# Patient Record
Sex: Female | Born: 1990 | Hispanic: Yes | Smoking: Never smoker
Health system: Southern US, Community
[De-identification: ages and names within clinical notes are randomized; demographics above are authoritative.]

---
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
## 2008-01-21 IMAGING — US US PELV - US TRANSVAGINAL
1 series · 17 of 25 positions shown · non-contrast
Comparison: none

REASON FOR EXAM: Severe sharp LLQ abdominal pain
COMMENTS:   LMP: Three weeks ago

PROCEDURE:     US  - US PELVIS MASS EXAM  - [DATE] [DATE] [DATE]  [DATE]
RESULT:     Comparison: No available comparison exam.

[Series 1: us pelv - us transvaginal · 17 of 30 slices shown]
[im 1/30]
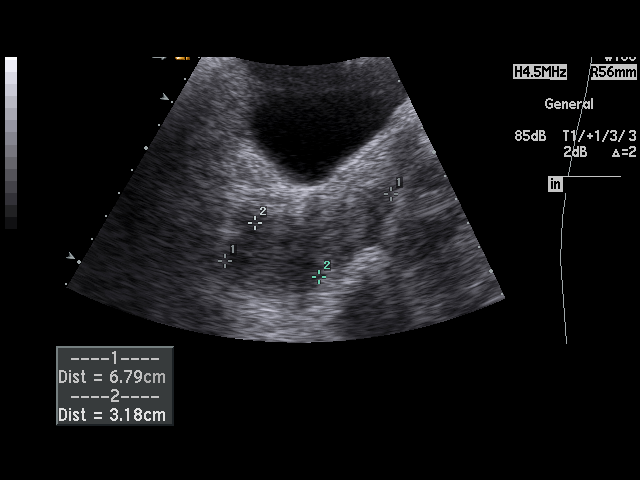
[im 3/30]
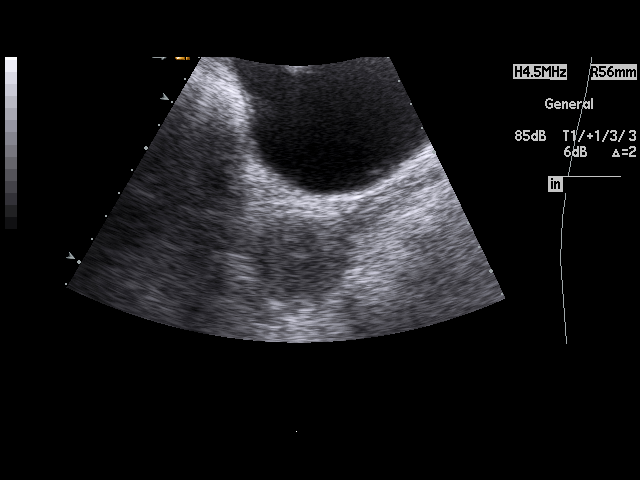
[im 4/30]
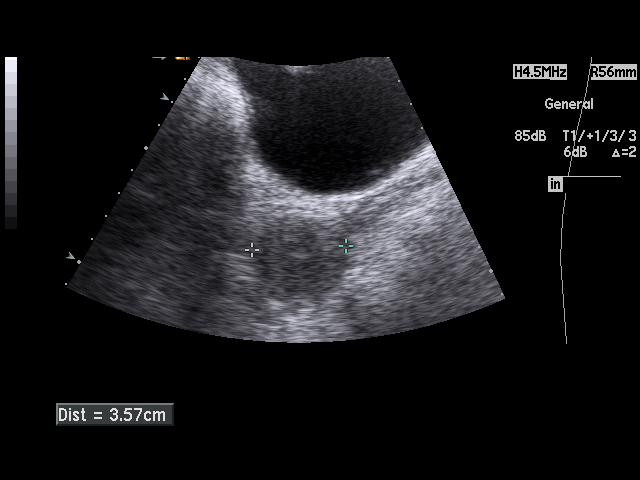
[im 7/30]
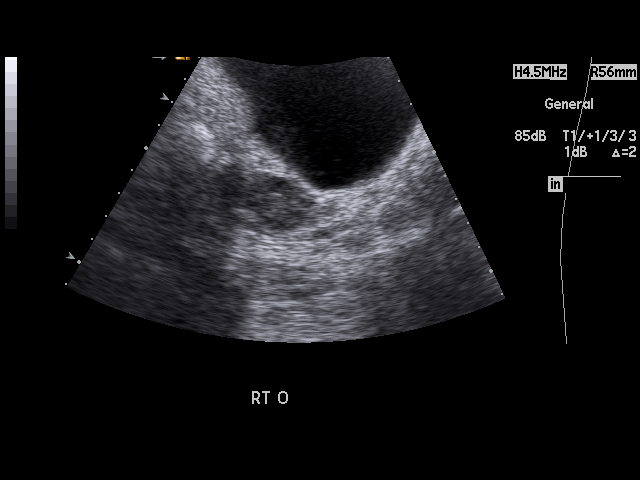
[im 8/30]
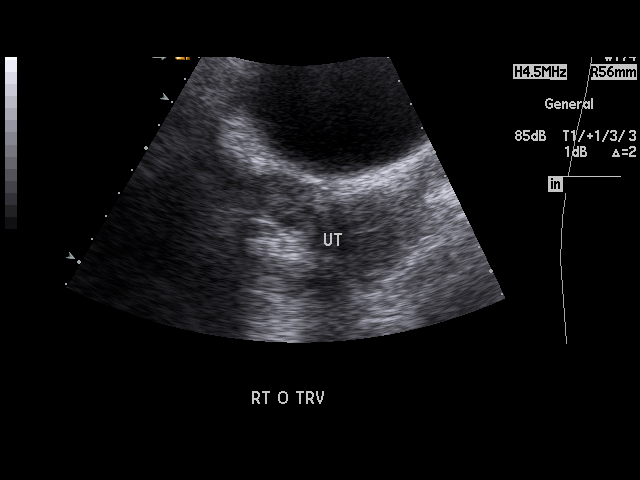
[im 10/30]
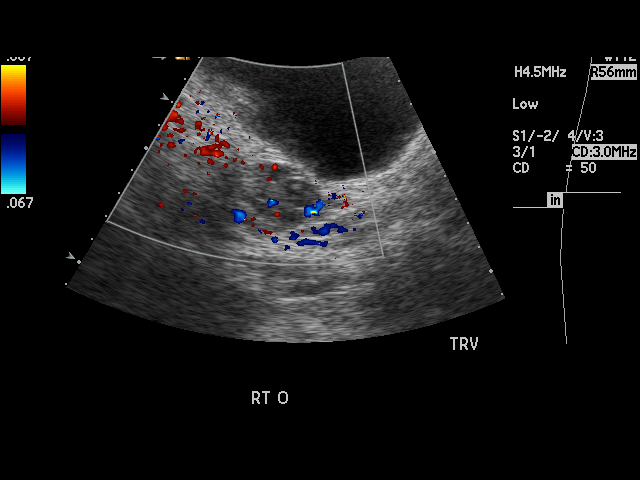
[im 11/30]
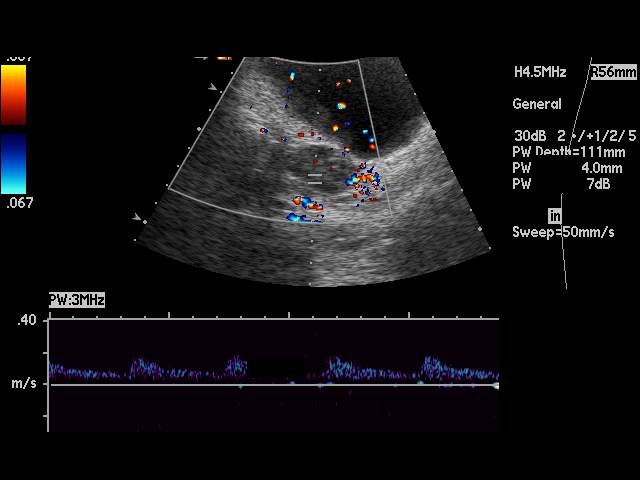
[im 14/30]
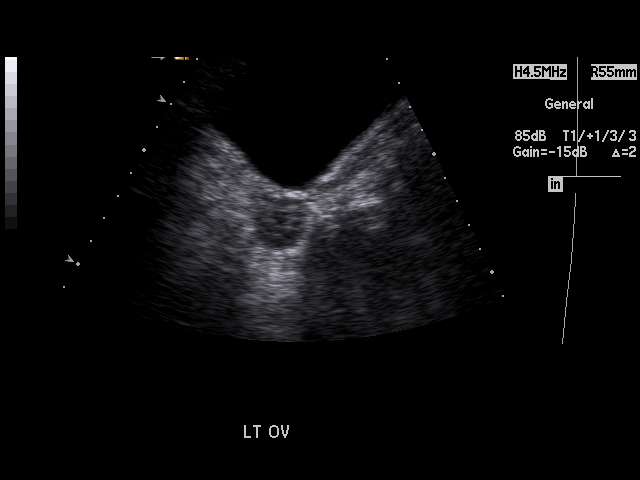
[im 15/30]
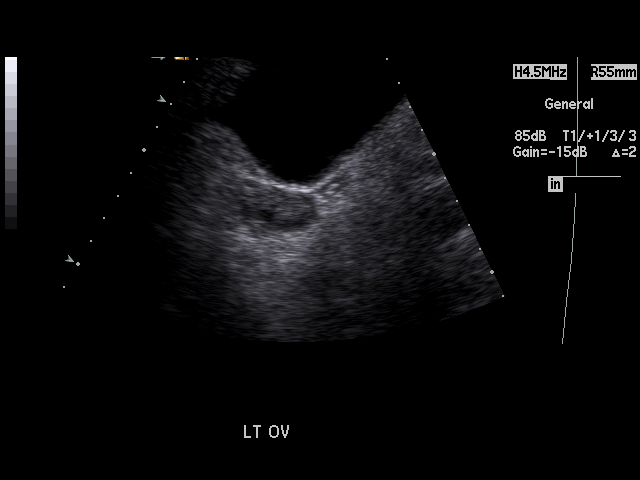
[im 16/30]
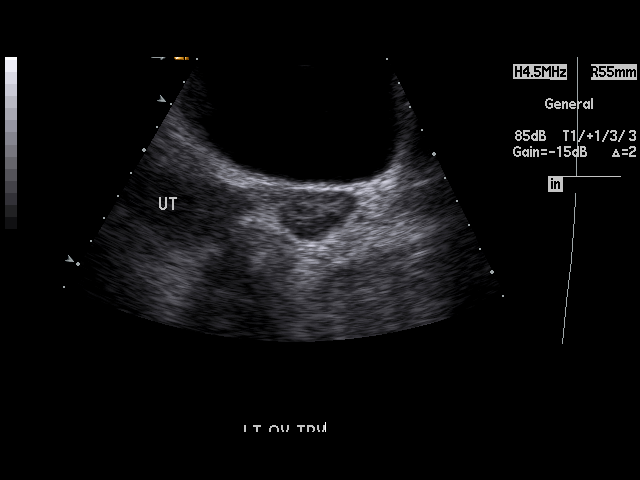
[im 19/30]
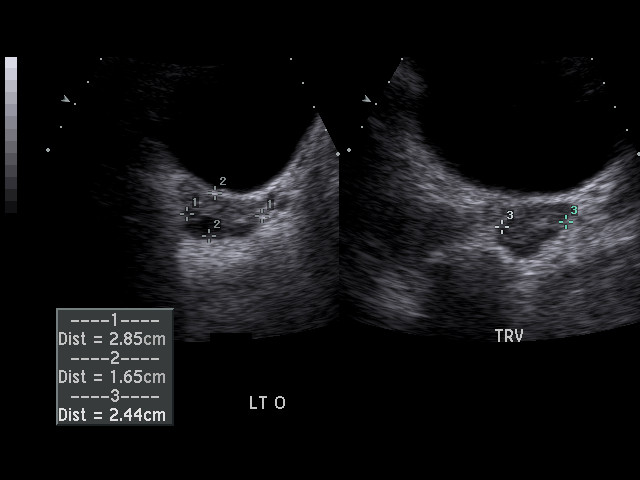
[im 20/30]
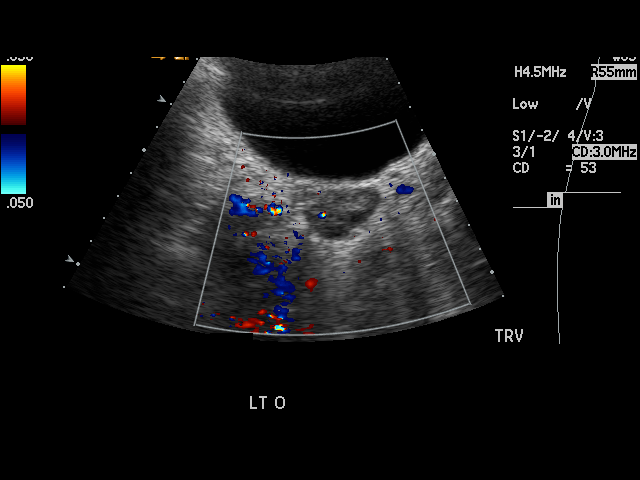
[im 22/30]
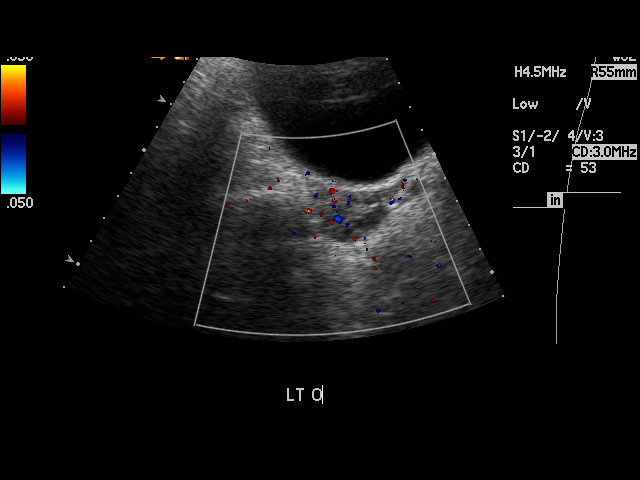
[im 23/30]
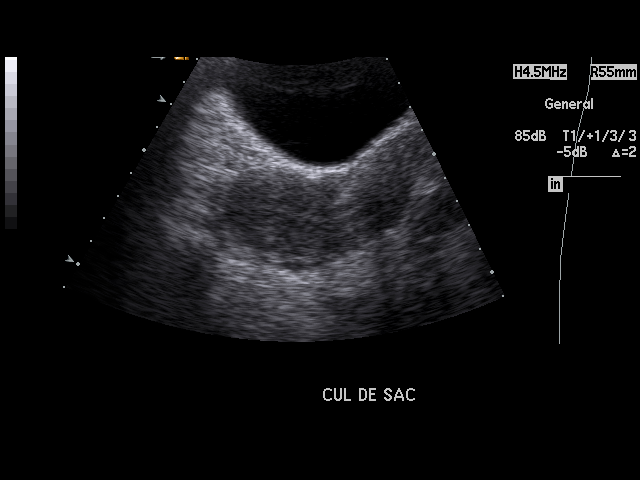
[im 26/30]
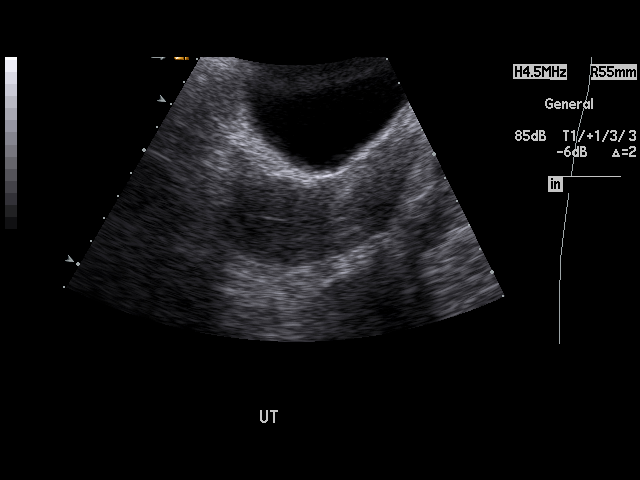
[im 27/30]
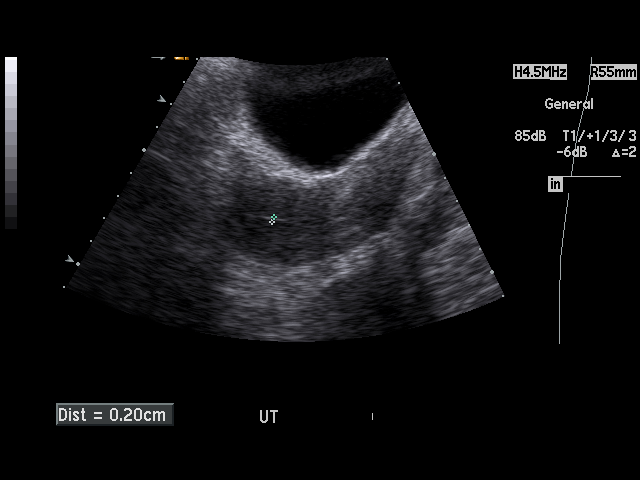
[im 30/30]
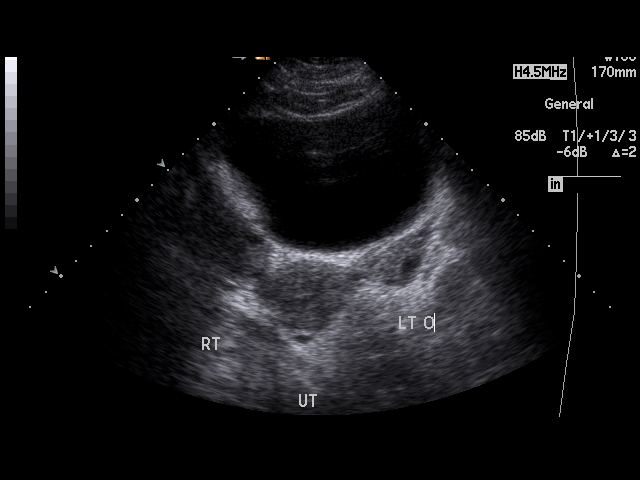

[17 of 25 positions shown; findings below may reference images not displayed]

FINDINGS: Transabdominal pelvic ultrasound was performed.

The uterus measures 6.8 x 3.2 x 3.6 cm. No focal mass is noted. The
endometrial strip measures 2 mm in thickness.

The right ovary measures 2.9 x 2.0 x 2.0 cm and the left measures 2.9 x
x 2.4 cm. No adnexal mass is seen. Vascular flow is seen in both ovaries.
There is no significant pelvic free fluid.
IMPRESSION: 1. Unremarkable pelvic ultrasound.

## 2020-10-21 ENCOUNTER — Telehealth: Payer: Self-pay | Admitting: Licensed Clinical Social Worker

## 2020-10-21 NOTE — Telephone Encounter (Signed)
-----   Message from Tiburcio Bash sent at 10/21/2020  9:41 AM EST ----- Regarding: Referral Hey Marchelle Folks!  I had a home visit with this mom yesterday and she expressed interest in seeing a therapist. I discussed different options with her, and I think you would be a good fit. She knows I am making the referral to you--she is okay with in person or virtual, I believe. Her son, who is 60 almost 31 years old, has a diagnosis of Cystic Fibrosis, and she is wanting help processing the emotions that have come along with this. Although he has been diagnosed since birth, she feels she has dealt with learning about the disease, becoming accustomed to doctor's visits, treatments, etc., but feels she has never addressed her emotions surrounding it. She deals with a lot of guilt, self-blame, sadness, things getting on top of her, etc. She expressed wanting to talk someone to help process and learn some coping skills. It might be a good client to have Lydia sit in on as well if Liara is willing. Mallary is bilingual and speaks perfect english--no interpreter required. Her current phone number is [phone].  Thanks! Santina Evans

## 2020-11-10 ENCOUNTER — Ambulatory Visit: Payer: Self-pay | Admitting: Licensed Clinical Social Worker

## 2020-11-18 ENCOUNTER — Ambulatory Visit: Payer: Self-pay | Admitting: Licensed Clinical Social Worker

## 2021-01-27 ENCOUNTER — Ambulatory Visit: Payer: Medicaid Other | Admitting: Licensed Clinical Social Worker

## 2021-01-27 ENCOUNTER — Encounter: Payer: Self-pay | Admitting: Licensed Clinical Social Worker

## 2021-01-27 DIAGNOSIS — F4322 Adjustment disorder with anxiety: Secondary | ICD-10-CM

## 2021-01-27 NOTE — Progress Notes (Signed)
Counselor Initial Adult Exam  Name: Anna Sandoval Date: 01/27/2021 MRN: [id number] DOB: [birth year] PCP: No primary care provider on file.  Time spent: 50 minutes   A biopsychosocial was completed on the Patient. Background information and current concerns were obtained during an intake in the office with the [facility] clinician, Anna Cosier, LCSW.  Anna Sandoval, [employer] Intern was also present in session with patient's verbal consent. Contact information and confidentiality was discussed and appropriate consents were signed.     Reason for Visit /Presenting Problem:  Patient presents with concerns of anxiety and excessive worrying. These symptoms began about two years ago after going through a divorce with her son's father. Patient reports that she is a single mom of a 4yo that has cystic fibrosis and she is constantly worrying about doing things right to keep him well. She reports that a few months ago he was kicked out of daycare due to behavioral concerns and since this incident she has been worrying even more about her ability to be an effective parent. Patient reports that she had her son evaluated by a mental health provider and was told that his behavior was typical "normal". She reports feeling very overwhelmed by all the many things she has to do on a daily basis to care for her child's disorder and having to do this on her own.  She shares that she hasn't worked for the past two years so she could stay with her son and care for him during the pandemic. She also reports that she has had to really limit her outside contact to prevent her son from getting Covid because of his disorder. Patient reports that she and her son's father co-parent well but she is the primary caretaker and feels like he only has to do the fun stuff. Patient also reports that things have been worrying her more now that her son is older and is starting to understand more and is asking more  questions about his disorder and about his family make up. Patient describes anxiety symptoms, including feeling anxious, difficulties controlling worries, worrying about many different things, muscle tension, and waking in the night that she reports are due to multiple stressful events over the past two years. In addition, patient reports moments of low mood and sadness.   Mental Status Exam:   Appearance:   Casual, Neat and Well Groomed     Behavior:  Appropriate, Sharing and Minimizing  Motor:  Normal  Speech/Language:   Normal Rate  Affect:  Appropriate, Congruent, Full Range and Tearful  Mood:  anxious and sad  Thought process:  normal  Thought content:    WNL  Sensory/Perceptual disturbances:    WNL  Orientation:  oriented to person, place, time/date and situation  Attention:  Good  Concentration:  Good  Memory:  WNL  Fund of knowledge:   Good  Insight:    Fair  Judgment:   Good  Impulse Control:  Good   Reported Symptoms:  anxiety, anxious thoughts, intermittent depressive symptoms- low mood, sadness  Risk Assessment: Danger to Self:  No Self-injurious Behavior: No Danger to Others: No Duty to Warn:no Physical Aggression / Violence:No  Access to Firearms a concern: No  Gang Involvement:No  Patient / guardian was educated about steps to take if suicide or homicide risk level increases between visits: yes While future psychiatric events cannot be accurately predicted, the patient does not currently require acute inpatient psychiatric care and does not currently meet [place]  [facility] involuntary commitment criteria.  Substance Abuse History: Current substance abuse: No     Past Psychiatric History:   No previous psychological problems have been observed Outpatient Providers:NA History of Psych Hospitalization: No   Abuse History: Patient denies any history of abuse.   Family History: History reviewed. No pertinent family history.  Social History:  Social History    Socioeconomic History  . Marital status: Divorced    Spouse name: NA  . Number of children: 1  . Years of education: 81  . Highest education level: High school graduate  Occupational History  . Not on file  Tobacco Use  . Smoking status: Unknown If Ever Smoked  . Smokeless tobacco: Not on file  Substance and Sexual Activity  . Alcohol use: Not on file  . Drug use: Never  . Sexual activity: Not on file  Other Topics Concern  . Not on file  Social History Narrative   Patient is a single mom, she and her child live together. Patient's son has cystic fibrosis. She divorced 2 years ago and is co-parenting with her son's father.    Social Determinants of Health   Financial Resource Strain: Not on file  Food Insecurity: Not on file  Transportation Needs: Not on file  Physical Activity: Unknown  . Days of Exercise per Week: 3 days  . 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    Legal History: Pending legal issue / charges: The patient has no significant history of legal issues. History of legal issue / charges: No  Medical History/Surgical History:reviewed History reviewed. No pertinent past  medical history.  History reviewed. No pertinent surgical history.  Medications: No current outpatient medications on file.   No current facility-administered medications for this visit.   Not on File  Anna Sandoval Anna Sandoval is a 30 y.o. year old female with no reported history of mental health diagnosis. Patient currently presents with anxiety symptoms that have developed and worsened over the past two years due to multiple stressors. She describes anxious mood, including feeling anxious, excessive worries, difficulties controlling worries, worrying about many different things, muscle tension, and sleep disturbance. GAD-7 = 8.   She also describes intermittent sadness and feeling down. Patient reports that these symptoms impact her functioning in multiple life domains.   Due to the above symptoms and patient's reported history, patient is diagnosed with Adjustment Disorder, with Anxiety. Patient's anxiety symptoms need to be monitored for diagnosis clarification. Continued mental health treatment is needed to address patient's symptoms and monitor her safety and stability. Patient is recommended for outpatient therapy to reduce her symptoms and improve her coping strategies.    There is no  acute risk for suicide or violence at this time.  While future psychiatric events cannot be accurately predicted, the patient does not require acute inpatient psychiatric care and does not currently meet [facility] involuntary commitment criteria.   Diagnoses:    ICD-10-CM   1. Adjustment disorder with anxious mood  F43.22     Plan of Care: Patient's goal of treatment is to learn to cope with anxiety and stress so it doesn't impact her family and her son.   -LCSW provided psychoeducation on CBTs. -LCSW and patient agreed to develop a treatment plan at next session.   Future Appointments  Date Time Provider Department Center  02/03/2021  1:30 PM Anna Cosier, LCSW [facility] None    Anna Sandoval, [place]

## 2021-02-03 ENCOUNTER — Ambulatory Visit: Payer: Medicaid Other | Admitting: Licensed Clinical Social Worker

## 2022-05-17 ENCOUNTER — Encounter: Payer: Self-pay | Admitting: Intensive Care

## 2022-05-17 ENCOUNTER — Emergency Department: Payer: Medicaid Other

## 2022-05-17 ENCOUNTER — Other Ambulatory Visit: Payer: Self-pay

## 2022-05-17 ENCOUNTER — Emergency Department
Admission: EM | Admit: 2022-05-17 | Discharge: 2022-05-17 | Disposition: A | Discharge status: Home / Self Care | Payer: Medicaid Other | Attending: Emergency Medicine | Admitting: Emergency Medicine

## 2022-05-17 DIAGNOSIS — J168 Pneumonia due to other specified infectious organisms: Secondary | ICD-10-CM | POA: Diagnosis not present

## 2022-05-17 DIAGNOSIS — J189 Pneumonia, unspecified organism: Secondary | ICD-10-CM

## 2022-05-17 DIAGNOSIS — R0602 Shortness of breath: Secondary | ICD-10-CM | POA: Diagnosis present

## 2022-05-17 LAB — CBC
HCT: 40.3 % (ref 36.0–46.0)
Hemoglobin: 13.3 g/dL (ref 12.0–15.0)
MCH: 32.2 pg (ref 26.0–34.0)
MCHC: 33 g/dL (ref 30.0–36.0)
MCV: 97.6 fL (ref 80.0–100.0)
Platelets: 182 10*3/uL (ref 150–400)
RBC: 4.13 MIL/uL (ref 3.87–5.11)
RDW: 12.1 % (ref 11.5–15.5)
WBC: 3.8 10*3/uL — ABNORMAL LOW (ref 4.0–10.5)
nRBC: 0 % (ref 0.0–0.2)

## 2022-05-17 LAB — TROPONIN I (HIGH SENSITIVITY): Troponin I (High Sensitivity): 2 ng/L (ref <18)

## 2022-05-17 LAB — BASIC METABOLIC PANEL
Anion gap: 7 (ref 5–15)
BUN: 10 mg/dL (ref 6–20)
CO2: 26 mmol/L (ref 22–32)
Calcium: 9 mg/dL (ref 8.9–10.3)
Chloride: 107 mmol/L (ref 98–111)
Creatinine, Ser: 0.6 mg/dL (ref 0.44–1.00)
GFR, Estimated: >60 mL/min (ref >60)
Glucose, Bld: 111 mg/dL — ABNORMAL HIGH (ref 70–99)
Potassium: 3.6 mmol/L (ref 3.5–5.1)
Sodium: 140 mmol/L (ref 135–145)

## 2022-05-17 MED ORDER — AMOXICILLIN 500 MG PO CAPS: 1000.0000 mg | ORAL_CAPSULE | Freq: Three times a day (TID) | ORAL | 0 refills | Status: AC

## 2022-05-17 NOTE — Discharge Instructions (Signed)
Please seek medical attention for any high fevers, chest pain, shortness of breath, change in behavior, persistent vomiting, bloody stool or any other new or concerning symptoms.  

## 2022-05-17 NOTE — ED Triage Notes (Addendum)
Patient c/o chest pain and sob 2-3 days. Hypertensive in triage with no history of HTN. Patient reports slipping last week and hitting head with intermittent dizziness since fall. Denies LOC

## 2022-05-17 NOTE — ED Provider Notes (Signed)
[facility] Provider Note    Event Date/Time   First MD Initiated Contact with Patient 05/17/22 1128     (approximate)   History   Chest Pain and Shortness of Breath   HPI  Anna Sandoval is a 31 y.o. female who presents to the emergency department today because of concerns for shortness of breath.  Started 3 days ago.  It has been fairly constant.  Has been accompanied by some chest discomfort.  She denies any cough.  Denies any fevers.  Denies any similar symptoms in the past.  No known sick contacts.  In addition patient states she did feel dizzy today.  Physical Exam   Triage Vital Signs: ED Triage Vitals  Enc Vitals Group     BP 05/17/22 1114 (!) 167/125     Pulse Rate 05/17/22 1114 87     Resp 05/17/22 1114 20     Temp 05/17/22 1114 98.5 F (36.9 C)     Temp Source 05/17/22 1114 Oral     SpO2 05/17/22 1114 96 %     Weight 05/17/22 1117 165 lb (74.8 kg)     Height 05/17/22 1117 5\' 5"  (1.651 m)     Head Circumference --      Peak Flow --      Pain Score 05/17/22 1117 7     Pain Loc --      Pain Edu? --      Excl. in [place]? --     Most recent vital signs: Vitals:   05/17/22 1114 05/17/22 1117  BP: (!) 167/125   Pulse: 87   Resp: 20   Temp: 98.5 F (36.9 C)   SpO2: 96% 97%   General: Awake, alert, oriented. CV:  Good peripheral perfusion. Regular rate and rhythm. Resp:  Normal effort. Lungs clear. Abd:  No distention.    ED Results / Procedures / Treatments   Labs (all labs ordered are listed, but only abnormal results are displayed) Labs Reviewed  BASIC METABOLIC PANEL - Abnormal; Notable for the following components:      Result Value   Glucose, Bld 111 (*)    All other components within normal limits  CBC - Abnormal; Notable for the following components:   WBC 3.8 (*)    All other components within normal limits  POC URINE PREG, ED  TROPONIN I (HIGH SENSITIVITY)     EKG  I, Nance Pear, attending physician,  personally viewed and interpreted this EKG  EKG Time: 1125 Rate: 92 Rhythm: sinus rhythm Axis: normal Intervals: qtc 437 QRS: narrow ST changes: no st elevation Impression: normal ekg  RADIOLOGY I independently interpreted and visualized the CXR. My interpretation: No pneumothorax.  Radiology interpretation:  IMPRESSION:  Patchy density at the left lung base suspicious for pneumonia.    PROCEDURES:  Critical Care performed: No  Procedures   MEDICATIONS ORDERED IN ED: Medications - No data to display   IMPRESSION / MDM / [place] / ED COURSE  I reviewed the triage vital signs and the nursing notes.                              Differential diagnosis includes, but is not limited to, pneumonia, pneumothorax, anemia.  Patient's presentation is most consistent with acute presentation with potential threat to life or bodily function.  Patient presents to the emergency department today because of concerns for shortness of breath.  On exam patient in no respiratory distress.  Lungs were clear.  Chest x-ray is concerning for possible pneumonia.  Do think this could explain the patient's symptoms.  Blood work without concerning leukocytosis or anemia.  I did consider PE given the patient is on birth control however I think very low likelihood given lack of peripheral swelling, pain, cough, tachycardia or chest pain with deep breaths.  I do think we have a likely etiology and the pneumonia.  I discussed this with the patient.  We will plan on discharging with prescription for antibiotics.  FINAL CLINICAL IMPRESSION(S) / ED DIAGNOSES   Final diagnoses:  Pneumonia due to infectious organism, unspecified laterality, unspecified part of lung     Note:  This document was prepared using Dragon voice recognition software and may include unintentional dictation errors.    Nance Pear, MD 05/17/22 [phone]

## 2022-10-12 IMAGING — CR DG CHEST 2V
1 series · 2 of 2 positions shown · non-contrast
Comparison: None Available.

CLINICAL DATA: Chest pain

EXAM:
CHEST - 2 VIEW

[Series 1: dg chest 2 view · 0.14mm/px · 2 of 2 slices shown]
[im 1/2]
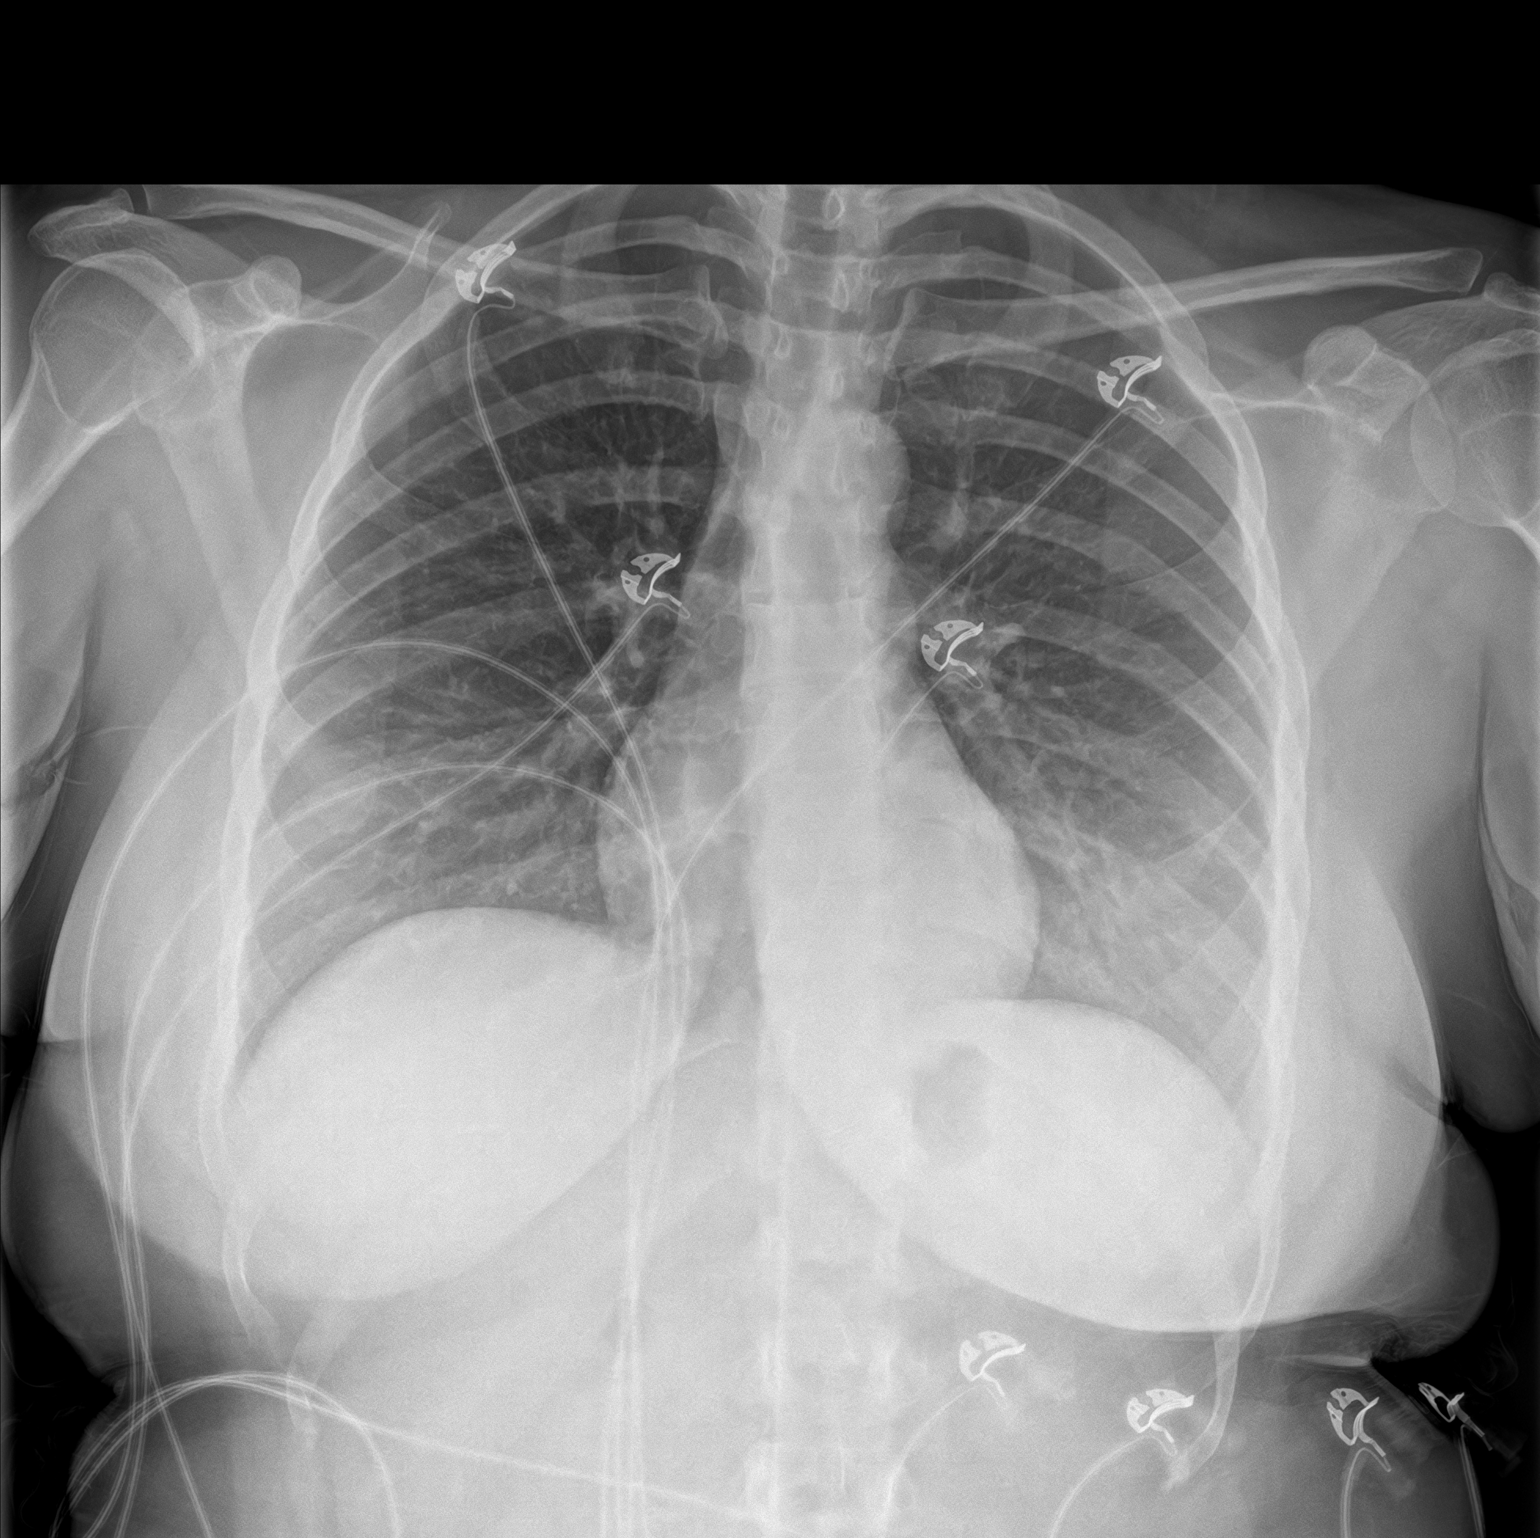
[im 2/2]
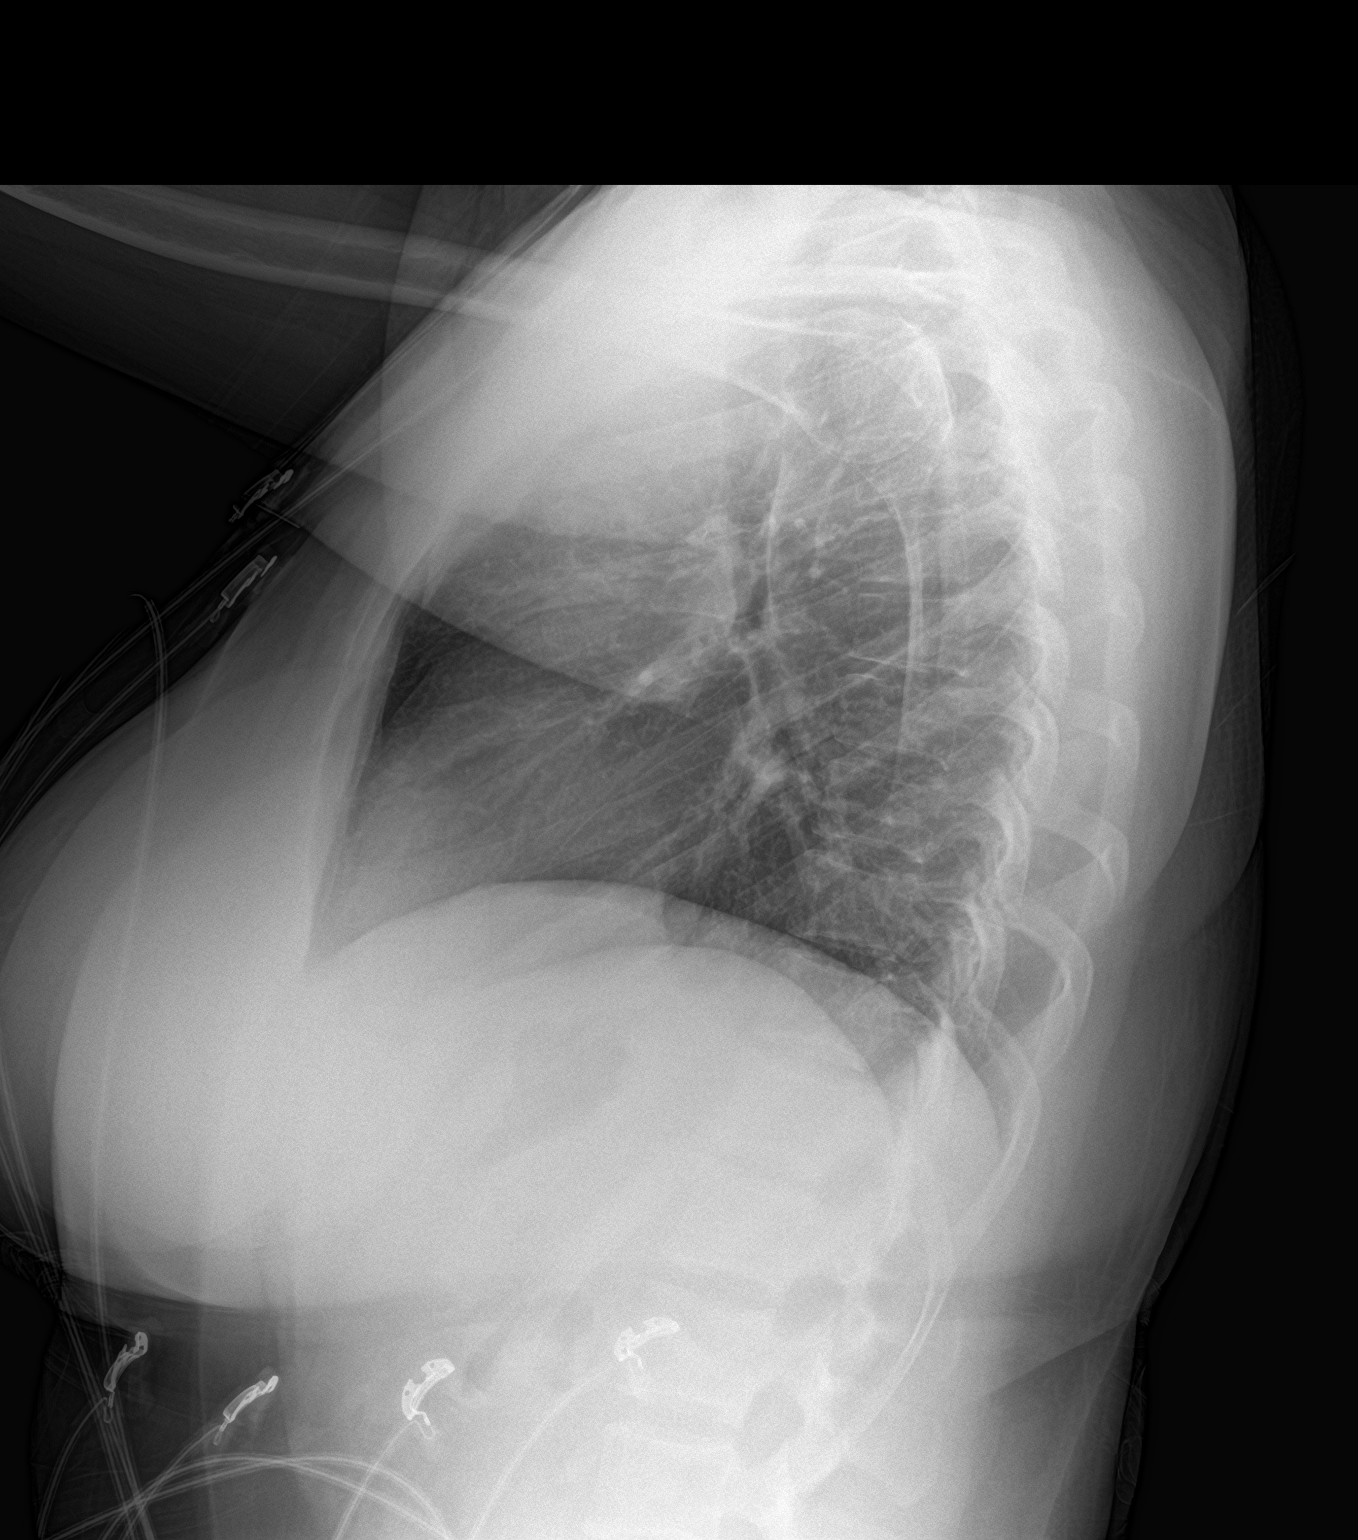

[2 of 2 positions shown; findings below may reference images not displayed]

FINDINGS: The heart size and mediastinal contours are within normal limits.
Patchy density at the left lung base. No pleural effusion or
pneumothorax. The visualized skeletal structures are unremarkable.
IMPRESSION: Patchy density at the left lung base suspicious for pneumonia.
